# Patient Record
Sex: Male | Born: 1950 | Race: White | Hispanic: No | Marital: Single
Health system: Southern US, Community
[De-identification: ages and names within clinical notes are randomized; demographics above are authoritative.]

---

## 2020-03-30 ENCOUNTER — Ambulatory Visit (INDEPENDENT_AMBULATORY_CARE_PROVIDER_SITE_OTHER): Payer: BC Managed Care – PPO | Admitting: Family Medicine

## 2020-03-30 ENCOUNTER — Encounter: Payer: Self-pay | Admitting: Family Medicine

## 2020-03-30 ENCOUNTER — Other Ambulatory Visit: Payer: Self-pay

## 2020-03-30 ENCOUNTER — Ambulatory Visit (HOSPITAL_BASED_OUTPATIENT_CLINIC_OR_DEPARTMENT_OTHER)
Admission: RE | Admit: 2020-03-30 | Discharge: 2020-03-30 | Disposition: A | Discharge status: Home / Self Care | Payer: BC Managed Care – PPO | Source: Ambulatory Visit | Attending: Family Medicine | Admitting: Family Medicine

## 2020-03-30 VITALS — BP 134/80 | Ht 75.0 in | Wt 220.0 lb

## 2020-03-30 DIAGNOSIS — M533 Sacrococcygeal disorders, not elsewhere classified: Secondary | ICD-10-CM | POA: Diagnosis present

## 2020-03-30 MED ORDER — PREDNISONE 5 MG PO TABS: ORAL_TABLET | ORAL | 0 refills | Status: AC

## 2020-03-30 NOTE — Progress Notes (Signed)
  Anthony Kent - 70 y.o. male MRN [id number]  Date of birth: [birth year]  SUBJECTIVE:  Including CC & ROS.  No chief complaint on file.   Anthony Kent is a 70 y.o. male that is presenting with left-sided low back pain.  The pain is occurring for the past 3 to 4 weeks.  No injury or inciting event.  No history of surgery.  No radicular pain.  Has got improvement with ibuprofen.   Review of Systems See HPI   HISTORY: Past Medical, Surgical, Social, and Family History Reviewed & Updated per EMR.   Pertinent Historical Findings include:  History reviewed. No pertinent past medical history.  History reviewed. No pertinent surgical history.  History reviewed. No pertinent family history.  Social History   Socioeconomic History  . Marital status: Single    Spouse name: Not on file  . Number of children: Not on file  . Years of education: Not on file  . Highest education level: Not on file  Occupational History  . Not on file  Tobacco Use  . Smoking status: Not on file  . Smokeless tobacco: Not on file  Substance and Sexual Activity  . Alcohol use: Not on file  . Drug use: Not on file  . Sexual activity: Not on file  Other Topics Concern  . Not on file  Social History Narrative  . Not on file   Social Determinants of Health   Financial Resource Strain: Not on file  Food Insecurity: Not on file  Transportation Needs: Not on file  Physical Activity: Not on file  Stress: Not on file  Social Connections: Not on file  Intimate Partner Violence: Not on file     PHYSICAL EXAM:  VS: BP 134/80 (BP Location: Right Arm, Patient Position: Sitting, Cuff Size: Large)   Ht 6\' 3"  (1.905 m)   Wt 220 lb (99.8 kg)   BMI 27.50 kg/m  Physical Exam Gen: NAD, alert, cooperative with exam, well-appearing MSK:  Back: Normal flexion and extension. Normal hip flexion. Some instability with hip flexion and abduction. Negative straight leg raise. Neurovascularly  intact     ASSESSMENT & PLAN:   SI (sacroiliac) joint dysfunction Symptoms seem to be associated with the left SI joint.  Less likely for nerve impingement -Counseled on home exercise therapy and supportive care. -X-ray. -Prednisone. -Could consider physical therapy or SI joint injection.

## 2020-03-30 NOTE — Patient Instructions (Signed)
Nice to meet you Please try heat before exercise and ice after  Please try the exercises  I will call with the results  Please send me a message in MyChart with any questions or updates.  Please see me back in 3 weeks.   --Dr. Jordan Likes

## 2020-03-30 NOTE — Assessment & Plan Note (Signed)
Symptoms seem to be associated with the left SI joint.  Less likely for nerve impingement -Counseled on home exercise therapy and supportive care. -X-ray. -Prednisone. -Could consider physical therapy or SI joint injection.

## 2020-04-05 ENCOUNTER — Telehealth: Payer: Self-pay | Admitting: Family Medicine

## 2020-04-05 NOTE — Telephone Encounter (Signed)
Left VM for patient. 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   If any questions then please take the best time and phone number to call and I will try to call him back.   Myra Rude, MD [facility] 04/05/2020, 9:38 AM

## 2020-04-10 ENCOUNTER — Ambulatory Visit: Payer: Self-pay

## 2020-04-10 ENCOUNTER — Other Ambulatory Visit: Payer: Self-pay

## 2020-04-10 ENCOUNTER — Ambulatory Visit (INDEPENDENT_AMBULATORY_CARE_PROVIDER_SITE_OTHER): Payer: BC Managed Care – PPO | Admitting: Family Medicine

## 2020-04-10 DIAGNOSIS — M533 Sacrococcygeal disorders, not elsewhere classified: Secondary | ICD-10-CM | POA: Diagnosis not present

## 2020-04-10 MED ORDER — TRIAMCINOLONE ACETONIDE 40 MG/ML IJ SUSP
40.0000 mg | Freq: Once | INTRAMUSCULAR | Status: AC
  Administered 2020-04-10: 40 mg via INTRA_ARTICULAR

## 2020-04-10 NOTE — Progress Notes (Signed)
  Anthony Kent - 70 y.o. male MRN [id number]  Date of birth: 13-Sep-1950  SUBJECTIVE:  Including CC & ROS.  No chief complaint on file.   Anthony Kent is a 70 y.o. male that is presenting with worsening of his left low back pain.  He is unable to do the home exercises because of the pain exacerbates the symptoms.  No specific radicular pain.   Review of Systems See HPI   HISTORY: Past Medical, Surgical, Social, and Family History Reviewed & Updated per EMR.   Pertinent Historical Findings include:  No past medical history on file.  No past surgical history on file.  No family history on file.  Social History   Socioeconomic History  . Marital status: Single    Spouse name: Not on file  . Number of children: Not on file  . Years of education: Not on file  . Highest education level: Not on file  Occupational History  . Not on file  Tobacco Use  . Smoking status: Not on file  . Smokeless tobacco: Not on file  Substance and Sexual Activity  . Alcohol use: Not on file  . Drug use: Not on file  . Sexual activity: Not on file  Other Topics Concern  . Not on file  Social History Narrative  . Not on file   Social Determinants of Health   Financial Resource Strain: Not on file  Food Insecurity: Not on file  Transportation Needs: Not on file  Physical Activity: Not on file  Stress: Not on file  Social Connections: Not on file  Intimate Partner Violence: Not on file     PHYSICAL EXAM:  VS: BP 120/80   Ht 6\' 3"  (1.905 m)   Wt 215 lb (97.5 kg)   BMI 26.87 kg/m  Physical Exam Gen: NAD, alert, cooperative with exam, well-appearing MSK:  Low back: No redness or swelling. Limited flexion and extension. Neurovascularly intact   Aspiration/Injection Procedure Note MIKAH POSS 08-01-1950  Procedure: Injection Indications: Left SI joint pain  Procedure Details Consent: Risks of procedure as well as the alternatives and risks of each were explained to the  (patient/caregiver).  Consent for procedure obtained. Time Out: Verified patient identification, verified procedure, site/side was marked, verified correct patient position, special equipment/implants available, medications/allergies/relevent history reviewed, required imaging and test results available.  Performed.  The area was cleaned with iodine and alcohol swabs.    The left SI joint is the source of the pain.  Ultrasound with as needed for visualization of is in direction of the needle.  The needle was appreciated at its location upon delivery of the medication.  The SI joint on the left side was injected using 4 cc of 1% lidocaine on a 3-1/2 inch needle 22-gauge.  The syringe was switched to mixture containing 1 cc's of 40 mg Kenalog and 4 cc's of 0.25% bupivacaine was injected.  Ultrasound was used. Images were obtained in long views showing the injection.     A sterile dressing was applied.  Patient did tolerate procedure well.     ASSESSMENT & PLAN:   SI (sacroiliac) joint dysfunction Pain has worsened.  May be associated with facet joints as well. -Counseled on home exercise therapy and supportive care - injection today.  - may consider MRI to further evaluate facet joints for facet injections.

## 2020-04-10 NOTE — Assessment & Plan Note (Addendum)
Pain has worsened.  May be associated with facet joints as well. -Counseled on home exercise therapy and supportive care - injection today.  - may consider MRI to further evaluate facet joints for facet injections.

## 2020-04-10 NOTE — Patient Instructions (Signed)
Good to see you Please continue to alternate heat and ice  Please try to continue the exercises Please let us know if your pain has improved or not. We may need to try further imaging.    Please send me a message in MyChart with any questions or updates.  Please see [place] back in 4 weeks.   --Dr. Jordan Likes

## 2020-04-19 ENCOUNTER — Ambulatory Visit: Payer: BC Managed Care – PPO | Admitting: Family Medicine

## 2020-05-04 ENCOUNTER — Ambulatory Visit: Payer: BC Managed Care – PPO | Admitting: Family Medicine

## 2020-05-04 NOTE — Progress Notes (Deleted)
  IYAN FLETT - 70 y.o. male MRN [id number]  Date of birth: 1950/04/26  SUBJECTIVE:  Including CC & ROS.  No chief complaint on file.   ALTAN KRAAI is a 70 y.o. male that is  ***.  ***   Review of Systems See HPI   HISTORY: Past Medical, Surgical, Social, and Family History Reviewed & Updated per EMR.   Pertinent Historical Findings include:  No past medical history on file.  No past surgical history on file.  No family history on file.  Social History   Socioeconomic History  . Marital status: Single    Spouse name: Not on file  . Number of children: Not on file  . Years of education: Not on file  . Highest education level: Not on file  Occupational History  . Not on file  Tobacco Use  . Smoking status: Not on file  . Smokeless tobacco: Not on file  Substance and Sexual Activity  . Alcohol use: Not on file  . Drug use: Not on file  . Sexual activity: Not on file  Other Topics Concern  . Not on file  Social History Narrative  . Not on file   Social Determinants of Health   Financial Resource Strain: Not on file  Food Insecurity: Not on file  Transportation Needs: Not on file  Physical Activity: Not on file  Stress: Not on file  Social Connections: Not on file  Intimate Partner Violence: Not on file     PHYSICAL EXAM:  VS: There were no vitals taken for this visit. Physical Exam Gen: NAD, alert, cooperative with exam, well-appearing MSK:  ***      ASSESSMENT & PLAN:   No problem-specific Assessment & Plan notes found for this encounter.

## 2022-03-08 IMAGING — DX DG LUMBAR SPINE 2-3V
3 series · 3 of 3 positions shown · non-contrast
Comparison: None.

CLINICAL DATA: Left-sided low back pain for 1 week.

EXAM:
LUMBAR SPINE - 2-3 VIEW

[l-spine ap]
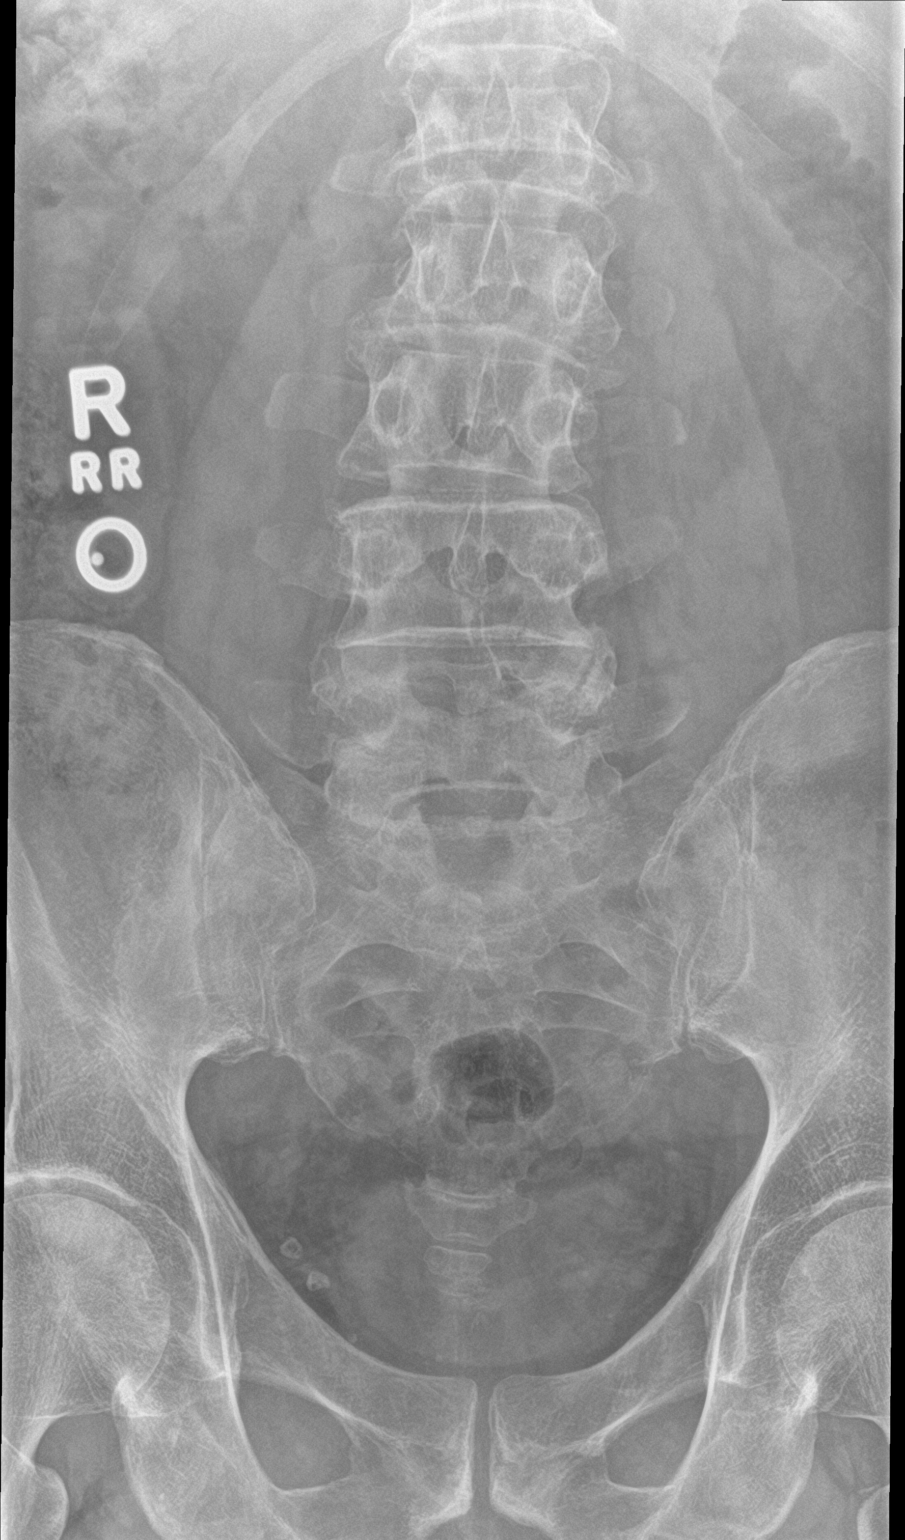

[l-spine lat]
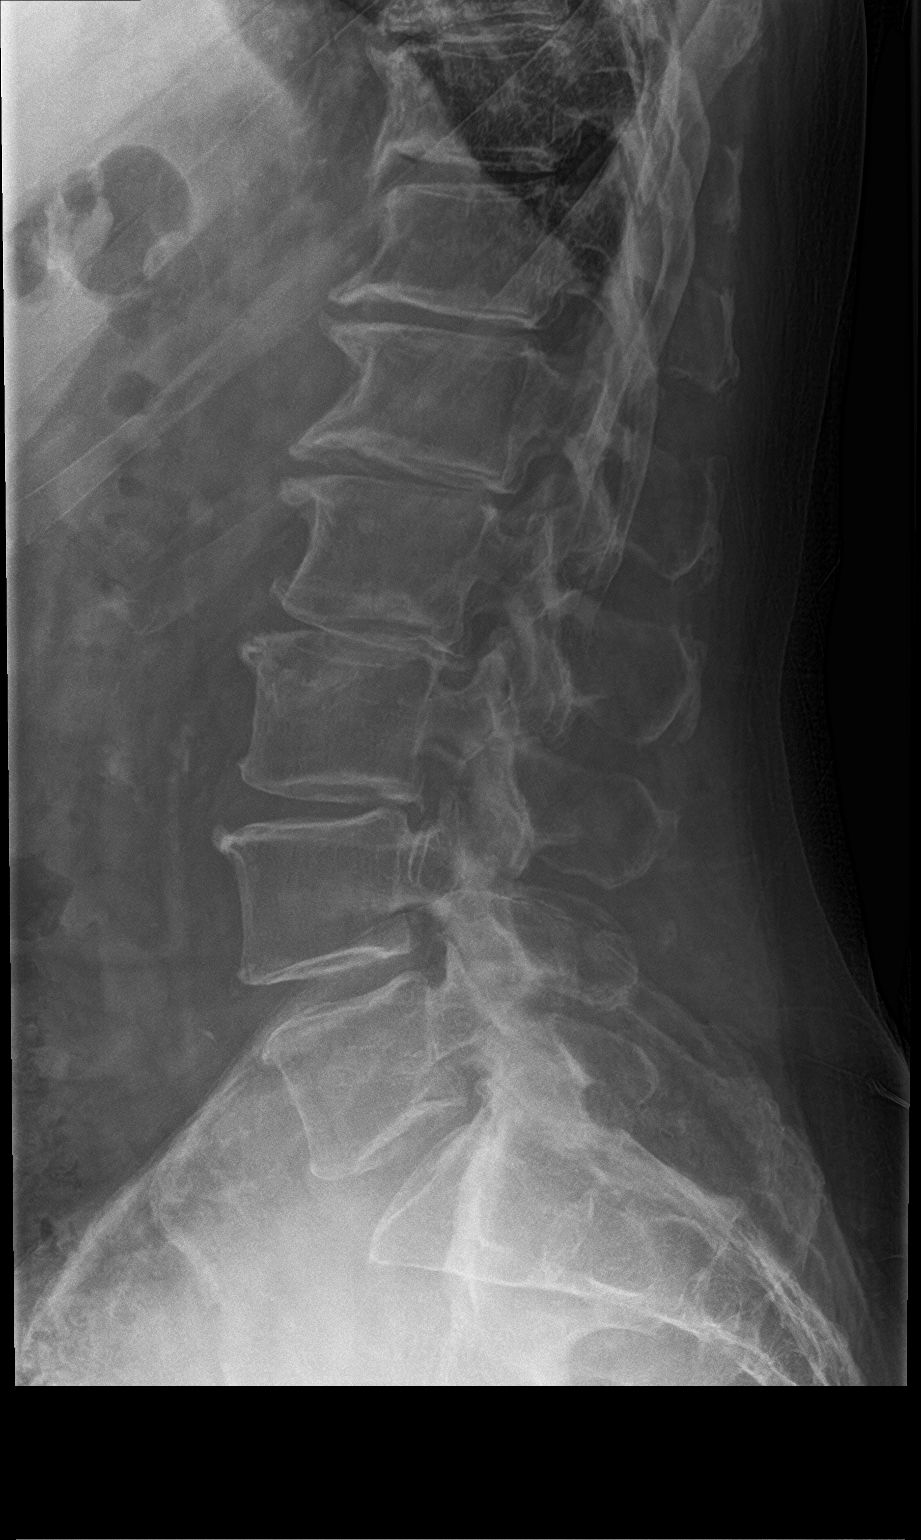

[l-spine spot]
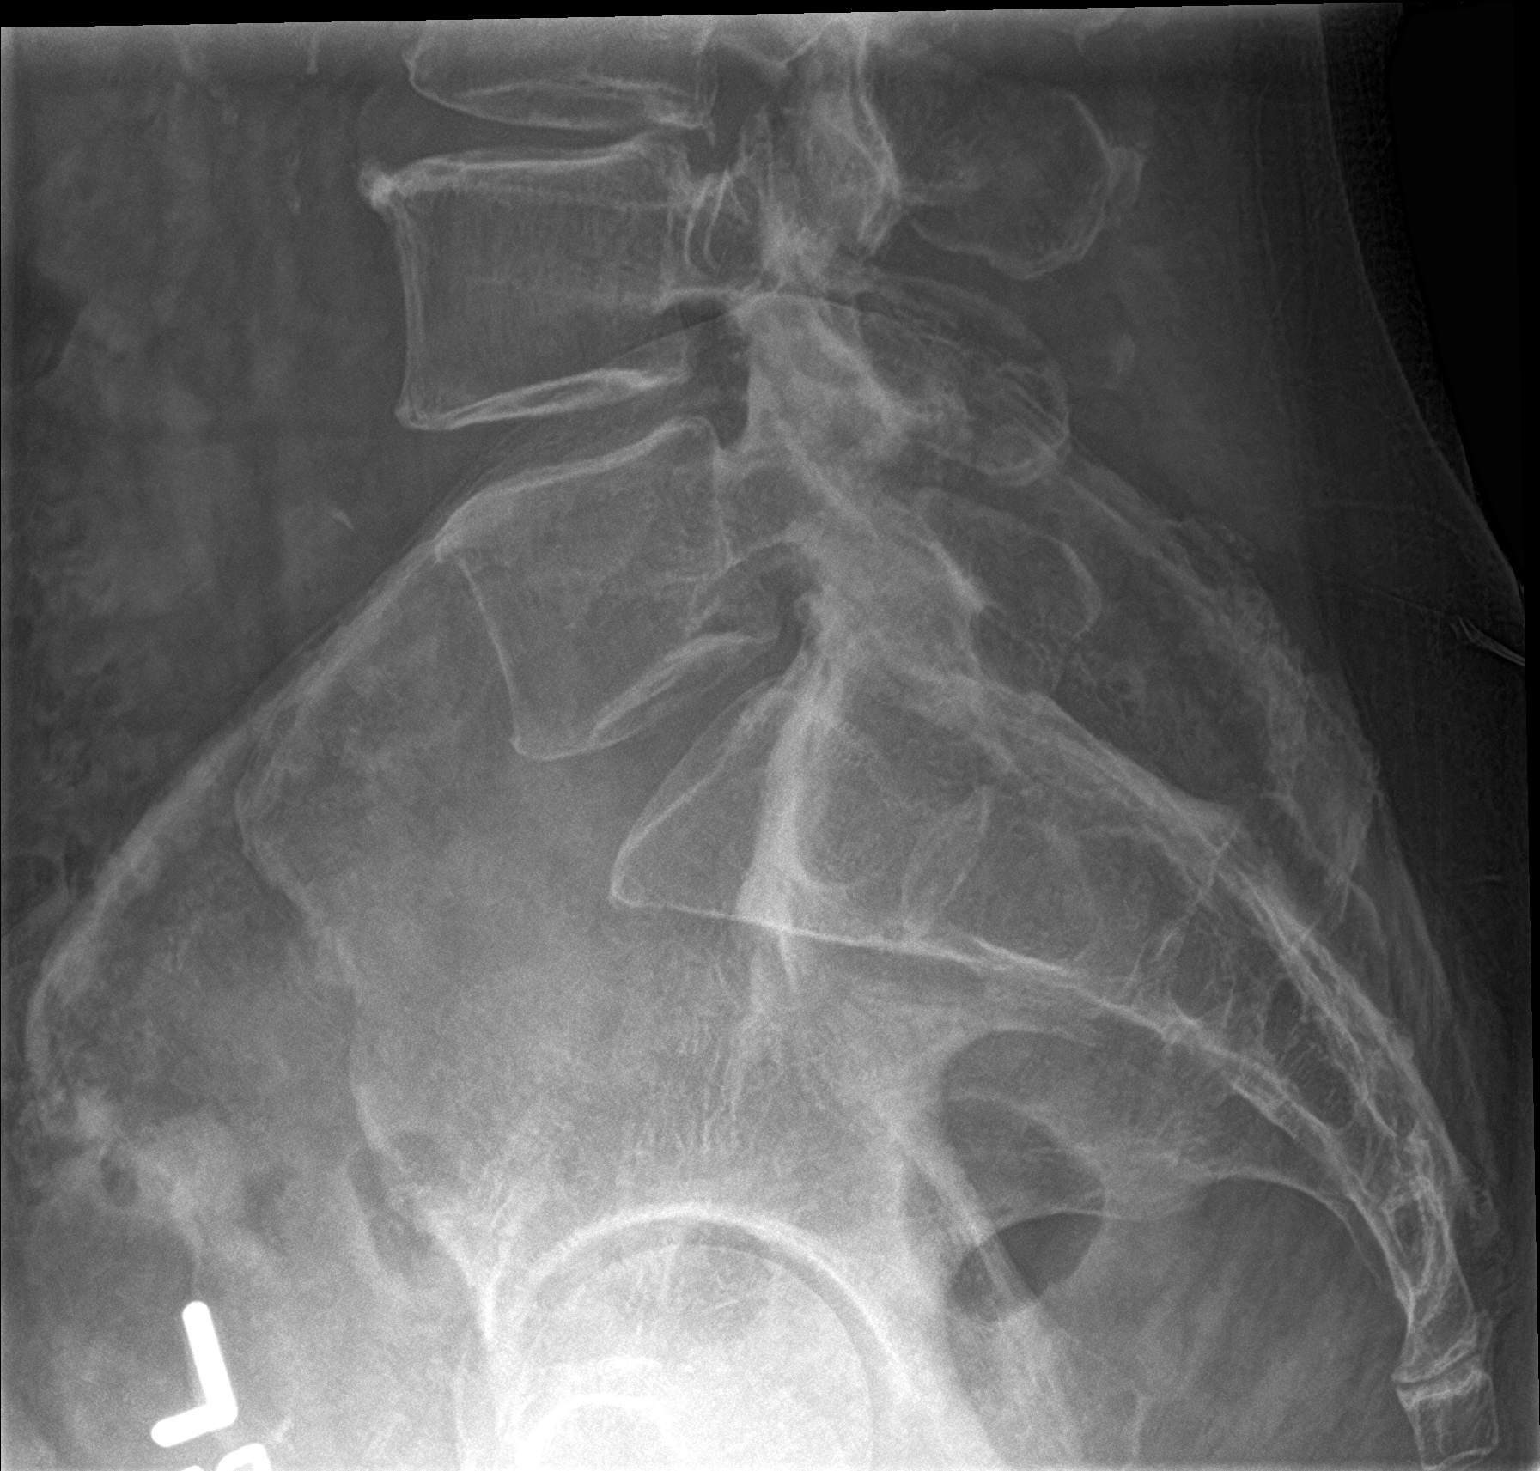

[3 of 3 positions shown; findings below may reference images not displayed]

FINDINGS: Five non rib-bearing lumbar type vertebral bodies are present.
Chronic loss of disc height is present at L1-2 L2-3, and L3-4.
Slight degenerative retrolisthesis present at L2-3 and L3-4. Minimal
degenerative anterolisthesis present at L4-5. Facet degenerative
changes are greatest at L4-5. Vascular calcifications are noted.
IMPRESSION: Chronic degenerative changes of the lumbar spine are most pronounced
at L4-5.

No acute abnormality.

## 2022-05-27 ENCOUNTER — Encounter: Payer: Self-pay | Admitting: *Deleted
